# Patient Record
Sex: Male | Born: 1955 | Race: White | Hispanic: No | Marital: Married | Smoking: Never smoker
Health system: Southern US, Community
[De-identification: ages and names within clinical notes are randomized; demographics above are authoritative.]

---

## 2005-09-11 ENCOUNTER — Ambulatory Visit (HOSPITAL_COMMUNITY): Admission: RE | Admit: 2005-09-11 | Discharge: 2005-09-11 | Payer: Self-pay | Admitting: Specialist

## 2005-10-14 ENCOUNTER — Ambulatory Visit (HOSPITAL_COMMUNITY): Admission: RE | Admit: 2005-10-14 | Discharge: 2005-10-16 | Payer: Self-pay | Admitting: Specialist

## 2008-06-18 ENCOUNTER — Encounter: Admission: RE | Admit: 2008-06-18 | Discharge: 2008-06-18 | Payer: Self-pay | Admitting: Specialist

## 2008-06-19 ENCOUNTER — Ambulatory Visit (HOSPITAL_BASED_OUTPATIENT_CLINIC_OR_DEPARTMENT_OTHER): Admission: RE | Admit: 2008-06-19 | Discharge: 2008-06-20 | Payer: Self-pay | Admitting: Specialist

## 2009-06-07 IMAGING — CR DG CHEST 2V
2 series · 2 of 2 positions shown · non-contrast
Comparison: Chest x-ray of 10/14/2005

CLINICAL DATA: Preop for shoulder surgery

CHEST - 2 VIEW

[w chest pa]
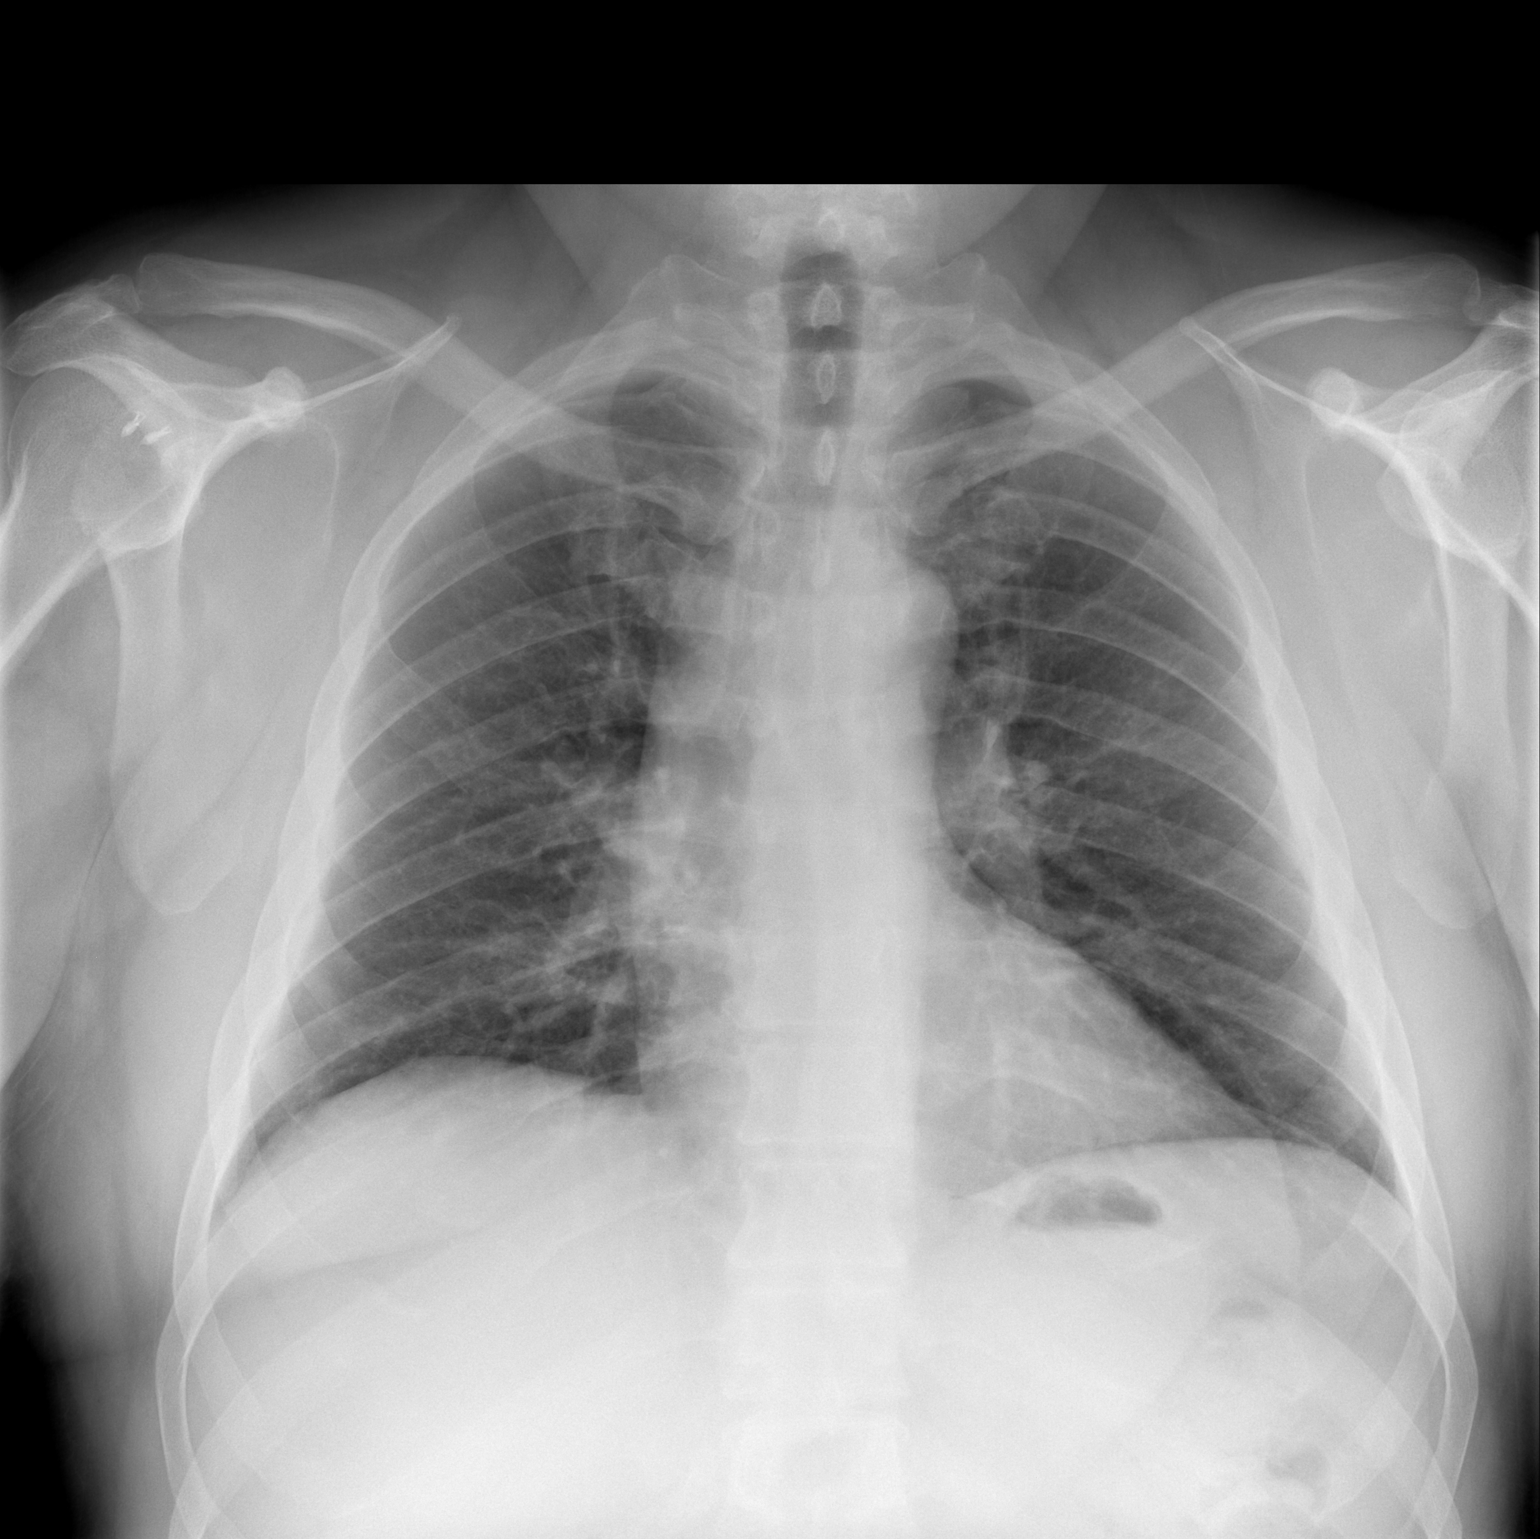

[w chest lat]
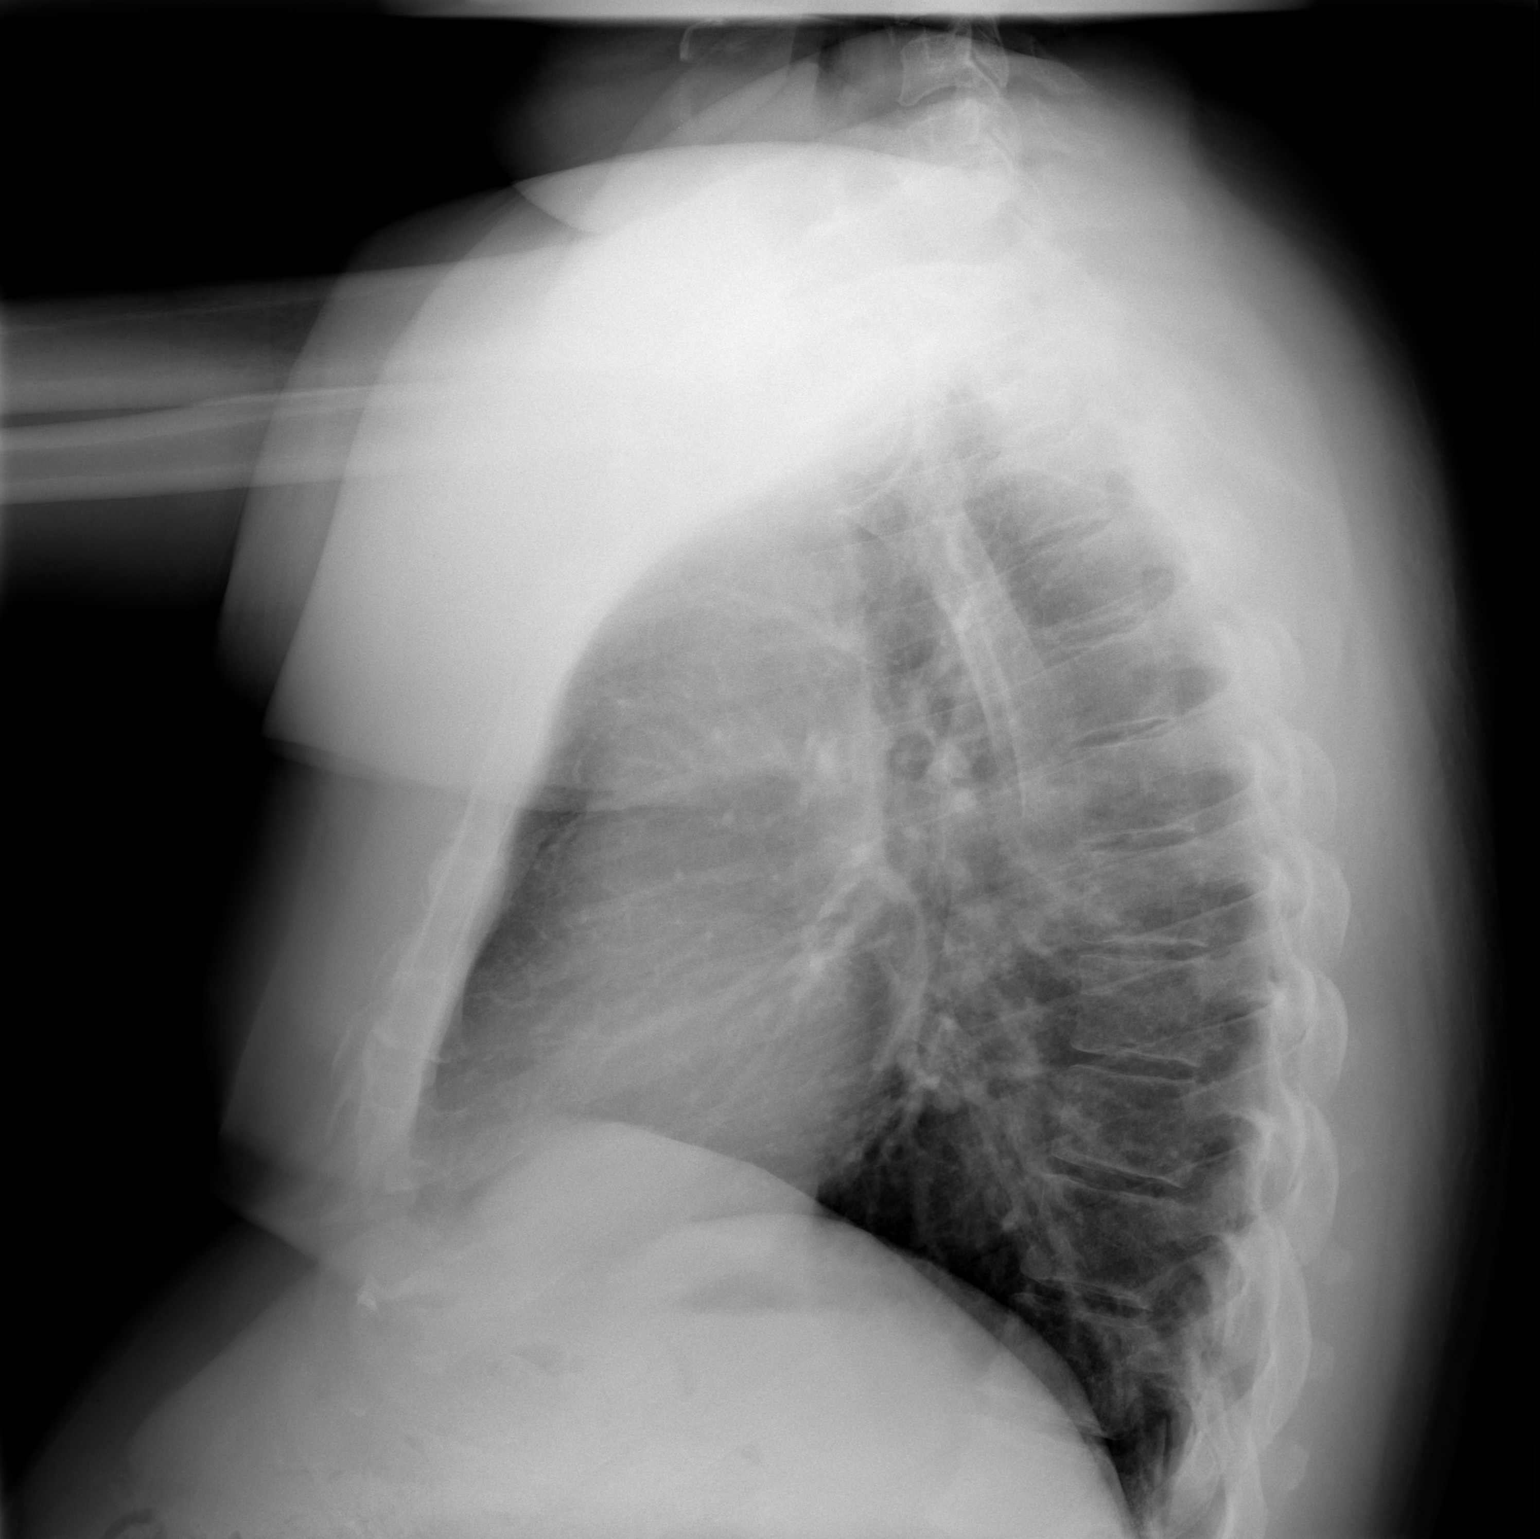

[2 of 2 positions shown; findings below may reference images not displayed]

FINDINGS: No active infiltrate or effusion is seen.  Mild
peribronchial thickening again is noted.  The heart is within upper
limits of normal.  No acute bony abnormality is seen.
IMPRESSION: Stable chest x-ray.  No active lung disease.

## 2010-11-16 NOTE — Op Note (Signed)
NAME:  Larry Boyer, Larry Boyer NO.:  [id number]   MEDICAL RECORD NO.:  [id number]          PATIENT TYPE:  AMB   LOCATION:  DSC                          FACILITY:  [facility]   PHYSICIAN:  Jene Every, M.D.    DATE OF BIRTH:  09-14-1955   DATE OF PROCEDURE:  06/19/2008  DATE OF DISCHARGE:                               OPERATIVE REPORT   PREOPERATIVE DIAGNOSIS:  Rotator cuff tear impingement syndrome, left  shoulder.   POSTOPERATIVE DIAGNOSIS:  Rotator cuff tear impingement syndrome, left  shoulder.   PROCEDURES PERFORMED:  1. Open acromioplasty.  2. Open rotator cuff repair.  3. Bursectomy and subacromial decompression.   ANESTHESIA:  General.   ASSISTANT:  Roma Schanz, PA   BRIEF INDICATION:  This is a 55 year old rotator cuff tear,  supraspinatus, pain, impingement syndrome, indicated for repair.  Risks  and benefits were discussed, wound, bleeding, infection, damage to  neurovascular structures, suboptimal range of motion, and adhesive  capsulitis retear.   TECHNIQUE:  The patient supine on beach chair position.  After induction  of adequate general anesthesia and 2 g of Kefzol, left shoulder and  upper extremities were prepped and draped in the usual sterile fashion.  An incision was made over the anterolateral aspect of the acromion.  Subcutaneous tissue was dissected, electrocauterized with good  hemostasis, raphe between the anterolateral heads was identified and  divided.  Subperiosteal elevator from the anterior and lateral aspect of  the acromion about midway.  CA ligament was detached.  The subacromial  space was digitally lysed.  The hypertrophic bursa was excised.  A small  retracted tear of the rotator cuff 1 cm x 1.5 cm was noted.  We debrided  the edges, fashioned a bed into the bleeding bone with a Matt Holmes rongeur.  We used a bioabsorbable suture anchor into the bone just lateral to the  articular surface after utilizing the awl, the tap  insertion of the Bio-  Anchor with excellent purchase.  There were 2 suture threads coming from  this and we threaded that through the anterolateral and the  posterolateral portion of the tendon, advanced it, held it, and formed a  good surgeon's knot on both sutures.  Then, crossed the sutures and then  put 2 PushLocks over the lateral aspect of the greater tuberosity.  After utilization of the awl, threading to the PushLock, tension placed  on the PushLock, and inserted it in with excellent double-row technique  fully covering the tendon.  Good range of motion without tension on the  site.   Wound was copiously irrigated.  We had used to oscillating saw to remove  the anterolateral aspect of the acromion.  We repaired the raphe with 0  Vicryl interrupted figure-of-eight sutures over and top through the  acromion, subcu with 2-0 Vicryl simple sutures.  Skin was reapproximated  with 3-0 subcuticular Prolene.  Wound was reinforced  with Steri-Strips.  Sterile dressing was applied, placed an abduction  pillow, extubated without difficulty, and transported to the recovery  room in satisfactory condition.   The patient tolerated the procedure well.  No complications.       Jene Every, M.D.  Electronically Signed     JB/MEDQ  D:  06/19/2008  T:  06/20/2008  Job:  [id number]

## 2010-11-19 NOTE — Op Note (Signed)
NAME:  Larry Boyer, Larry Boyer NO.:  [id number]   MEDICAL RECORD NO.:  [id number]          PATIENT TYPE:  OIB   LOCATION:  1516                         FACILITY:  [facility]   PHYSICIAN:  Jene Every, M.D.    DATE OF BIRTH:  1955-07-10   DATE OF PROCEDURE:  10/14/2005  DATE OF DISCHARGE:                                 OPERATIVE REPORT   PREOPERATIVE DIAGNOSES:  Rotator cuff tear, adhesive capsulitis and  impingement syndrome, right shoulder.   POSTOPERATIVE DIAGNOSIS:  Rotator cuff tear, adhesive capsulitis and  impingement syndrome, right shoulder.   PROCEDURES PERFORMED:  Mini-open rotator cuff repair, subacromial  decompression with acromioplasty and bursectomy.   ANESTHESIA:  General.   ASSISTANT:  Roma Schanz, P.A.   BRIEF HISTORY AND INDICATION:  A 55 year old with rotator cuff tear,  persistent pain, impingement, diminished range of motion.  Operative  intervention was indicated for decompression of the subacromial space and  rotator cuff repair.  Risks and benefits discussed including bleeding,  infection, damage to neurovascular structures, no change in symptoms,  worsening symptoms, need for repeat procedure in the future, etc.   TECHNIQUE:  The patient was placed supine in the beach-chair.  After the  induction of adequate general anesthesia and 2 g Kefzol, the right shoulder  and upper extremity was prepped and draped in the usual sterile fashion.  An  incision was made over the anterior aspect of the acromion in Langer's  lines.  Subcutaneous tissue was dissected.  Electrocautery was utilized to  achieve hemostasis.  The raphe between the anterior and lateral heads of the  deltoid was identified and divided approximately 3.5 cm in length.  Subperiosteally elevated over the anterolateral aspect of the acromion and  the anteromedial aspect of the acromion.  A self-retaining retractor was  placed.  The CA ligament was divided and excised.  A large  anterior spur was  removed with a Beyer rongeur.  I then digitally mobilized the subacromial  space with severe adhesions noted.  There was significant impingement still  noted.  Therefore, I used an oscillating saw to enlarge the acromioplasty.  He had good residual acromion noted.  This provided adequate space.  Next  hypertrophic bursa was excised.  There was attenuation and a full-thickness  tear of the rotator cuff at the insertion of the supraspinatus with  significant tendinopathy of the rotator cuff tear noted.  Excised the  degenerated portion of the rotator cuff, curetted a trough in the greater  tuberosity.  I placed two Mitek suture anchors in the trough.  One Mitek  delivery handle failed and it had to be removed as well as the suture  anchor.  The two remaining were placed approximately 1.5 cm apart over the  lateral aspect of the humerus and the greater tuberosity.  They were then  threaded through the thick part of the rotator cuff.  The rotator cuff was  advanced slightly and delivered into the trough, held and repaired with the  Ethibond suture with good repair.  There was some residual tearing that was  repaired  side-to-side with 0 Vicryl interrupted figure-of-eight sutures for  a watertight closure.  Good range without residual impingement and excellent  coverage of the rotator cuff.  Again tendinopathy was noted with edema and  swelling in the rotator cuff tendon.  Inspected the remainder of the tendon  and the tear.  There was no evidence of residual tear.  This was mainly in  the supraspinatus.  Near the conclusion of the case, the patient had some  lower systolic blood pressure, had to be retained in the supine position for  a period of time until fluids were given and blood pressure was restored.  The patient was then placed back in the beach-chair position.  I completed  the acromioplasty with a 3 mm Kerrison and a rasp to smooth the  undersurface.  The wound was  copiously irrigated with antibiotic irrigation,  the raphe repaired with #1 Vicryl in interrupted figure-of-eight sutures  with excellent closure.  The subcutaneous tissue reapproximated with 0 and 2-  0 Vicryl simple sutures.  The skin was reapproximated with 4-0 subcuticular  Prolene.  The wound reinforced with Steri-Strips, a sterile dressing  applied.  Placed in an abduction pillow, extubated without difficulty and  transported to the recovery room in satisfactory condition.   The patient tolerated the procedure well with no complications.      Jene Every, M.D.  Electronically Signed     JB/MEDQ  D:  10/14/2005  T:  10/15/2005  Job:  [id number]

## 2011-04-08 LAB — BASIC METABOLIC PANEL
BUN: 16 mg/dL (ref 6–23)
CO2: 26 mEq/L (ref 19–32)
Calcium: 9.8 mg/dL (ref 8.4–10.5)
Chloride: 101 mEq/L (ref 96–112)
Creatinine, Ser: 1.23 mg/dL (ref 0.4–1.5)
GFR calc Af Amer: >60 mL/min (ref >60)
GFR calc non Af Amer: >60 mL/min (ref >60)
Glucose, Bld: 154 mg/dL — ABNORMAL HIGH (ref 70–99)
Potassium: 3.9 mEq/L (ref 3.5–5.1)
Sodium: 136 mEq/L (ref 135–145)

## 2011-04-08 LAB — CBC
HCT: 47.4 % (ref 39.0–52.0)
Hemoglobin: 16.1 g/dL (ref 13.0–17.0)
MCHC: 34.1 g/dL (ref 30.0–36.0)
MCV: 88.7 fL (ref 78.0–100.0)
Platelets: 263 10*3/uL (ref 150–400)
RBC: 5.34 MIL/uL (ref 4.22–5.81)
RDW: 12.9 % (ref 11.5–15.5)
WBC: 7.8 10*3/uL (ref 4.0–10.5)

## 2014-01-08 ENCOUNTER — Encounter: Payer: Self-pay | Admitting: Gastroenterology

## 2014-03-14 ENCOUNTER — Encounter: Payer: Self-pay | Admitting: Gastroenterology

## 2014-05-09 ENCOUNTER — Encounter: Payer: Self-pay | Admitting: Gastroenterology

## 2019-07-28 ENCOUNTER — Ambulatory Visit: Payer: Medicare Other | Attending: Internal Medicine

## 2019-07-28 DIAGNOSIS — Z23 Encounter for immunization: Secondary | ICD-10-CM | POA: Insufficient documentation

## 2019-07-28 NOTE — Progress Notes (Signed)
   Covid-19 Vaccination Clinic  Name:  Larry Boyer    MRN: [id number] DOB: 1956-03-17  07/28/2019  Larry Boyer was observed post Covid-19 immunization for 15 minutes without incidence. He was provided with Vaccine Information Sheet and instruction to access the V-Safe system.   Larry Boyer was instructed to call 911 with any severe reactions post vaccine: Marland Kitchen Difficulty breathing  . Swelling of your face and throat  . A fast heartbeat  . A bad rash all over your body  . Dizziness and weakness    Immunizations Administered    Name Date Dose VIS Date Route   Pfizer COVID-19 Vaccine 07/28/2019 10:31 AM 0.3 mL 06/14/2019 Intramuscular   Manufacturer: [employer], [employer]   Lot: [id number]   NDC: [address]-4

## 2019-08-19 ENCOUNTER — Ambulatory Visit: Payer: Medicare Other | Attending: Internal Medicine

## 2019-08-19 DIAGNOSIS — Z23 Encounter for immunization: Secondary | ICD-10-CM | POA: Insufficient documentation

## 2019-08-19 NOTE — Progress Notes (Signed)
   Covid-19 Vaccination Clinic  Name:  Larry Boyer    MRN: [id number] DOB: 11/06/1955  08/19/2019  Larry Boyer was observed post Covid-19 immunization for 15 minutes without incidence. He was provided with Vaccine Information Sheet and instruction to access the V-Safe system.   Larry Boyer was instructed to call 911 with any severe reactions post vaccine: Marland Kitchen Difficulty breathing  . Swelling of your face and throat  . A fast heartbeat  . A bad rash all over your body  . Dizziness and weakness    Immunizations Administered    Name Date Dose VIS Date Route   Pfizer COVID-19 Vaccine 08/19/2019  8:23 AM 0.3 mL 06/14/2019 Intramuscular   Manufacturer: [employer], [employer]   Lot: [id number]   NDC: [id number]

## 2020-02-26 ENCOUNTER — Other Ambulatory Visit: Payer: Self-pay | Admitting: Physician Assistant

## 2020-02-26 DIAGNOSIS — N182 Chronic kidney disease, stage 2 (mild): Secondary | ICD-10-CM

## 2020-02-26 DIAGNOSIS — U071 COVID-19: Secondary | ICD-10-CM

## 2020-02-26 DIAGNOSIS — Z794 Long term (current) use of insulin: Secondary | ICD-10-CM

## 2020-02-26 DIAGNOSIS — E119 Type 2 diabetes mellitus without complications: Secondary | ICD-10-CM

## 2020-02-26 NOTE — Progress Notes (Signed)
I connected by phone with Larry Boyer on 02/26/2020 at 7:40 PM to discuss the potential use of a new treatment for mild to moderate COVID-19 viral infection in non-hospitalized patients.  This patient is a 64 y.o. male that meets the FDA criteria for Emergency Use Authorization of COVID monoclonal antibody casirivimab/imdevimab.  Has a (+) direct SARS-CoV-2 viral test result  Has mild or moderate COVID-19   Is NOT hospitalized due to COVID-19  Is within 10 days of symptom onset  Has at least one of the high risk factor(s) for progression to severe COVID-19 and/or hospitalization as defined in EUA.  Specific high risk criteria : Older age (>/= 66 yo), BMI > 25, Chronic Kidney Disease (CKD) and Diabetes   I have spoken and communicated the following to the patient or parent/caregiver regarding COVID monoclonal antibody treatment:  1. FDA has authorized the emergency use for the treatment of mild to moderate COVID-19 in adults and pediatric patients with positive results of direct SARS-CoV-2 viral testing who are 90 years of age and older weighing at least 40 kg, and who are at high risk for progressing to severe COVID-19 and/or hospitalization.  2. The significant known and potential risks and benefits of COVID monoclonal antibody, and the extent to which such potential risks and benefits are unknown.  3. Information on available alternative treatments and the risks and benefits of those alternatives, including clinical trials.  4. Patients treated with COVID monoclonal antibody should continue to self-isolate and use infection control measures (e.g., wear mask, isolate, social distance, avoid sharing personal items, clean and disinfect "high touch" surfaces, and frequent handwashing) according to CDC guidelines.   5. The patient or parent/caregiver has the option to accept or refuse COVID monoclonal antibody treatment.  After reviewing this information with the patient, The patient  agreed to proceed with receiving casirivimab\imdevimab infusion and will be provided a copy of the Fact sheet prior to receiving the infusion.   Larry Boyer 02/26/2020 7:40 PM

## 2020-02-27 ENCOUNTER — Ambulatory Visit (HOSPITAL_COMMUNITY)
Admission: RE | Admit: 2020-02-27 | Discharge: 2020-02-27 | Disposition: A | Discharge status: Home / Self Care | Payer: Medicare Other | Source: Ambulatory Visit | Attending: Pulmonary Disease | Admitting: Pulmonary Disease

## 2020-02-27 DIAGNOSIS — Z23 Encounter for immunization: Secondary | ICD-10-CM | POA: Insufficient documentation

## 2020-02-27 DIAGNOSIS — U071 COVID-19: Secondary | ICD-10-CM | POA: Diagnosis present

## 2020-02-27 MED ORDER — DIPHENHYDRAMINE HCL 50 MG/ML IJ SOLN: 50.0000 mg | Freq: Once | INTRAMUSCULAR | Status: DC | PRN

## 2020-02-27 MED ORDER — FAMOTIDINE IN NACL 20-0.9 MG/50ML-% IV SOLN: 20.0000 mg | Freq: Once | INTRAVENOUS | Status: DC | PRN

## 2020-02-27 MED ORDER — EPINEPHRINE 0.3 MG/0.3ML IJ SOAJ: 0.3000 mg | Freq: Once | INTRAMUSCULAR | Status: DC | PRN

## 2020-02-27 MED ORDER — SODIUM CHLORIDE 0.9 % IV SOLN: INTRAVENOUS | Status: DC | PRN

## 2020-02-27 MED ORDER — METHYLPREDNISOLONE SODIUM SUCC 125 MG IJ SOLR: 125.0000 mg | Freq: Once | INTRAMUSCULAR | Status: DC | PRN

## 2020-02-27 MED ORDER — ALBUTEROL SULFATE HFA 108 (90 BASE) MCG/ACT IN AERS: 2.0000 | INHALATION_SPRAY | Freq: Once | RESPIRATORY_TRACT | Status: DC | PRN

## 2020-02-27 MED ORDER — SODIUM CHLORIDE 0.9 % IV SOLN
1200.0000 mg | Freq: Once | INTRAVENOUS | Status: AC
  Administered 2020-02-27: 1200 mg via INTRAVENOUS
  Filled 2020-02-27: qty 10

## 2020-02-27 NOTE — Discharge Instructions (Signed)

## 2020-02-27 NOTE — Progress Notes (Signed)
  Diagnosis: COVID-19  Physician:Dr Wright  Procedure: Covid Infusion Clinic Med: casirivimab\imdevimab infusion - Provided patient with casirivimab\imdevimab fact sheet for patients, parents and caregivers prior to infusion.  Complications: No immediate complications noted.  Discharge: Discharged home   Larry Boyer 02/27/2020  

## 2021-07-30 ENCOUNTER — Emergency Department (HOSPITAL_BASED_OUTPATIENT_CLINIC_OR_DEPARTMENT_OTHER): Payer: Medicare Other

## 2021-07-30 ENCOUNTER — Other Ambulatory Visit: Payer: Self-pay

## 2021-07-30 ENCOUNTER — Emergency Department (HOSPITAL_BASED_OUTPATIENT_CLINIC_OR_DEPARTMENT_OTHER)
Admission: EM | Admit: 2021-07-30 | Discharge: 2021-07-30 | Disposition: A | Discharge status: Home / Self Care | Payer: Medicare Other | Attending: Emergency Medicine | Admitting: Emergency Medicine

## 2021-07-30 ENCOUNTER — Other Ambulatory Visit (HOSPITAL_BASED_OUTPATIENT_CLINIC_OR_DEPARTMENT_OTHER): Payer: Self-pay

## 2021-07-30 ENCOUNTER — Encounter (HOSPITAL_BASED_OUTPATIENT_CLINIC_OR_DEPARTMENT_OTHER): Payer: Self-pay

## 2021-07-30 DIAGNOSIS — N390 Urinary tract infection, site not specified: Secondary | ICD-10-CM

## 2021-07-30 DIAGNOSIS — N3091 Cystitis, unspecified with hematuria: Secondary | ICD-10-CM | POA: Insufficient documentation

## 2021-07-30 DIAGNOSIS — R309 Painful micturition, unspecified: Secondary | ICD-10-CM | POA: Diagnosis present

## 2021-07-30 DIAGNOSIS — R319 Hematuria, unspecified: Secondary | ICD-10-CM

## 2021-07-30 DIAGNOSIS — R6883 Chills (without fever): Secondary | ICD-10-CM | POA: Diagnosis not present

## 2021-07-30 DIAGNOSIS — N410 Acute prostatitis: Secondary | ICD-10-CM | POA: Diagnosis not present

## 2021-07-30 LAB — COMPREHENSIVE METABOLIC PANEL
ALT: 16 U/L (ref 0–44)
AST: 11 U/L — ABNORMAL LOW (ref 15–41)
Albumin: 4.1 g/dL (ref 3.5–5.0)
Alkaline Phosphatase: 87 U/L (ref 38–126)
Anion gap: 11 (ref 5–15)
BUN: 19 mg/dL (ref 8–23)
CO2: 22 mmol/L (ref 22–32)
Calcium: 9.4 mg/dL (ref 8.9–10.3)
Chloride: 96 mmol/L — ABNORMAL LOW (ref 98–111)
Creatinine, Ser: 1.11 mg/dL (ref 0.61–1.24)
GFR, Estimated: >60 mL/min (ref >60)
Glucose, Bld: 203 mg/dL — ABNORMAL HIGH (ref 70–99)
Potassium: 3.5 mmol/L (ref 3.5–5.1)
Sodium: 129 mmol/L — ABNORMAL LOW (ref 135–145)
Total Bilirubin: 1.1 mg/dL (ref 0.3–1.2)
Total Protein: 6.7 g/dL (ref 6.5–8.1)

## 2021-07-30 LAB — URINALYSIS, ROUTINE W REFLEX MICROSCOPIC
Bilirubin Urine: NEGATIVE
Glucose, UA: >1000 mg/dL — AB
Ketones, ur: 15 mg/dL — AB
Nitrite: NEGATIVE
Protein, ur: NEGATIVE mg/dL
Specific Gravity, Urine: 1.03 (ref 1.005–1.030)
WBC, UA: >50 WBC/hpf — ABNORMAL HIGH (ref 0–5)
pH: 5 (ref 5.0–8.0)

## 2021-07-30 LAB — CBC WITH DIFFERENTIAL/PLATELET
Abs Immature Granulocytes: 0.07 10*3/uL (ref 0.00–0.07)
Basophils Absolute: 0 10*3/uL (ref 0.0–0.1)
Basophils Relative: 0 %
Eosinophils Absolute: 0 10*3/uL (ref 0.0–0.5)
Eosinophils Relative: 0 %
HCT: 43.5 % (ref 39.0–52.0)
Hemoglobin: 14.9 g/dL (ref 13.0–17.0)
Immature Granulocytes: 1 %
Lymphocytes Relative: 8 %
Lymphs Abs: 1.2 10*3/uL (ref 0.7–4.0)
MCH: 29.7 pg (ref 26.0–34.0)
MCHC: 34.3 g/dL (ref 30.0–36.0)
MCV: 86.7 fL (ref 80.0–100.0)
Monocytes Absolute: 0.7 10*3/uL (ref 0.1–1.0)
Monocytes Relative: 5 %
Neutro Abs: 12.1 10*3/uL — ABNORMAL HIGH (ref 1.7–7.7)
Neutrophils Relative %: 86 %
Platelets: 206 10*3/uL (ref 150–400)
RBC: 5.02 MIL/uL (ref 4.22–5.81)
RDW: 12.2 % (ref 11.5–15.5)
WBC: 14 10*3/uL — ABNORMAL HIGH (ref 4.0–10.5)
nRBC: 0 % (ref 0.0–0.2)

## 2021-07-30 MED ORDER — SODIUM CHLORIDE 0.9 % IV BOLUS
1000.0000 mL | Freq: Once | INTRAVENOUS | Status: AC
  Administered 2021-07-30: 1000 mL via INTRAVENOUS

## 2021-07-30 MED ORDER — SULFAMETHOXAZOLE-TRIMETHOPRIM 800-160 MG PO TABS
1.0000 | ORAL_TABLET | Freq: Two times a day (BID) | ORAL | 0 refills | Status: AC
  Filled 2021-07-30: qty 14, 7d supply, fill #0

## 2021-07-30 MED ORDER — SODIUM CHLORIDE 0.9 % IV SOLN
1.0000 g | Freq: Once | INTRAVENOUS | Status: AC
  Administered 2021-07-30: 1 g via INTRAVENOUS
  Filled 2021-07-30: qty 10

## 2021-07-30 MED ORDER — ACETAMINOPHEN 325 MG PO TABS
650.0000 mg | ORAL_TABLET | Freq: Once | ORAL | Status: AC
  Administered 2021-07-30: 650 mg via ORAL
  Filled 2021-07-30: qty 2

## 2021-07-30 NOTE — ED Provider Notes (Signed)
[facility] EMERGENCY DEPT Provider Note   CSN: [id number] Arrival date & time: 07/30/21  1301     History  Chief Complaint  Patient presents with   Urinary Frequency   Flank Pain    Larry Boyer is a 66 y.o. male.  Patient presents with right lower flank pain and frequency with burning sensation.  Symptoms ongoing since yesterday.  He states that he had chills yesterday, but no recorded temperatures.  Currently states that the symptoms have improved currently and she feels much better at this time.      Home Medications Prior to Admission medications   Medication Sig Start Date End Date Taking? Authorizing Provider  sulfamethoxazole-trimethoprim (BACTRIM DS) 800-160 MG tablet Take 1 tablet by mouth 2 (two) times daily for 7 days. 07/30/21 08/06/21 Yes Luna Fuse, MD      Allergies    Patient has no known allergies.    Review of Systems   Review of Systems  Constitutional:  Positive for chills. Negative for fever.  HENT:  Negative for ear pain and sore throat.   Eyes:  Negative for pain.  Respiratory:  Negative for cough.   Cardiovascular:  Negative for chest pain.  Gastrointestinal:  Negative for abdominal pain.  Genitourinary:  Positive for flank pain.  Musculoskeletal:  Negative for back pain.  Skin:  Negative for color change and rash.  Neurological:  Negative for syncope.  All other systems reviewed and are negative.  Physical Exam Updated Vital Signs BP 105/76    Pulse 98    Temp 99.8 F (37.7 C) (Oral)    Resp 17    Ht 6\' 4"  (1.93 m)    Wt 104.3 kg    SpO2 93%    BMI 28.00 kg/m  Physical Exam Constitutional:      Appearance: He is well-developed.  HENT:     Head: Normocephalic.     Nose: Nose normal.  Eyes:     Extraocular Movements: Extraocular movements intact.  Cardiovascular:     Rate and Rhythm: Normal rate.  Pulmonary:     Effort: Pulmonary effort is normal.  Abdominal:     Tenderness: There is no abdominal tenderness.  There is no right CVA tenderness, left CVA tenderness, guarding or rebound.  Skin:    Coloration: Skin is not jaundiced.  Neurological:     Mental Status: He is alert. Mental status is at baseline.    ED Results / Procedures / Treatments   Labs (all labs ordered are listed, but only abnormal results are displayed) Labs Reviewed  URINALYSIS, ROUTINE W REFLEX MICROSCOPIC - Abnormal; Notable for the following components:      Result Value   Glucose, UA >1,000 (*)    Hgb urine dipstick SMALL (*)    Ketones, ur 15 (*)    Leukocytes,Ua SMALL (*)    WBC, UA >50 (*)    Bacteria, UA RARE (*)    All other components within normal limits  CBC WITH DIFFERENTIAL/PLATELET - Abnormal; Notable for the following components:   WBC 14.0 (*)    Neutro Abs 12.1 (*)    All other components within normal limits  COMPREHENSIVE METABOLIC PANEL - Abnormal; Notable for the following components:   Sodium 129 (*)    Chloride 96 (*)    Glucose, Bld 203 (*)    AST 11 (*)    All other components within normal limits  CULTURE, BLOOD (ROUTINE X 2)  CULTURE, BLOOD (ROUTINE X 2)  URINE CULTURE    EKG None  Radiology CT Renal Stone Study  Result Date: 07/30/2021 CLINICAL DATA:  Flank pain, kidney stone suspected EXAM: CT ABDOMEN AND PELVIS WITHOUT CONTRAST TECHNIQUE: Multidetector CT imaging of the abdomen and pelvis was performed following the standard protocol without IV contrast. RADIATION DOSE REDUCTION: This exam was performed according to the departmental dose-optimization program which includes automated exposure control, adjustment of the mA and/or kV according to patient size and/or use of iterative reconstruction technique. COMPARISON:  None. FINDINGS: Lower chest: Patchy opacities in the right lung base, favored to represent postinflammatory changes. Hepatobiliary: No focal liver abnormality is seen. No gallstones, gallbladder wall thickening, or biliary dilatation. Pancreas: Unremarkable. No  pancreatic ductal dilatation or surrounding inflammatory changes. Spleen: Normal in size without focal abnormality. Adrenals/Urinary Tract: Adrenal glands are unremarkable. Kidneys are normal, without renal calculi, focal lesion, or hydronephrosis. Mild stranding along the base of the bladder/near the prostate. The bladder is otherwise unremarkable. Stomach/Bowel: The stomach is within normal limits. There is no evidence of bowel obstruction.Normal appendix. Sigmoid diverticulosis. No diverticulitis. Moderate colonic stool burden. Vascular/Lymphatic: Aortoiliac atherosclerotic calcifications. No AAA. No lymphadenopathy. Reproductive: Mildly enlarged prostate with a somewhat heterogeneous appearance and adjacent stranding. Other: No abdominal wall hernia or abnormality. No abdominopelvic ascites. Musculoskeletal: No acute osseous abnormality. Multilevel degenerative changes of the spine. Bilateral hip osteoarthritis. IMPRESSION: Mildly enlarged heterogeneous prostate with adjacent inflammatory stranding suggesting the possibility of prostatitis. No obstructive uropathy. Patchy opacities in right lung base, favored to represent postinflammatory changes. Electronically Signed   By: Maurine Simmering M.D.   On: 07/30/2021 14:23    Procedures Procedures    Medications Ordered in ED Medications  cefTRIAXone (ROCEPHIN) 1 g in sodium chloride 0.9 % 100 mL IVPB (has no administration in time range)  sodium chloride 0.9 % bolus 1,000 mL (has no administration in time range)  acetaminophen (TYLENOL) tablet 650 mg (650 mg Oral Given 07/30/21 1346)    ED Course/ Medical Decision Making/ A&P                           Medical Decision Making Amount and/or Complexity of Data Reviewed Independent Historian: spouse    Details: Review of records show no recent clinical visits within the last 3 years. Labs: ordered. Radiology: ordered. Discussion of management or test interpretation with external provider(s): Labs show  mild leukocytosis, some hyponatremia noted as well, given IV fluid resuscitation.  CT abdomen pelvis shows no evidence of kidney stone, some concern for prostatic infection.  Urinalysis positive for WBCs and rare bacteria.  Given patient is well-appearing with improved symptoms, given IV Rocephin here in the ER.  Discharged home with a prescription of Bactrim.  Advised outpatient follow-up with urology within 2 to 3 days, advising immediate return for fevers worsening symptoms or any additional concerns.   Risk OTC drugs. Decision regarding hospitalization. Risk Details: Patient does have elevated white count but he has improvement of symptoms with no pain or discomfort at this time.  CT findings also appear mild.  Vital signs otherwise remained stable.  I feel he is a good candidate for trial of outpatient antibiotics.           Final Clinical Impression(s) / ED Diagnoses Final diagnoses:  Acute prostatitis  Urinary tract infection with hematuria, site unspecified    Rx / DC Orders ED Discharge Orders          Ordered    sulfamethoxazole-trimethoprim (  BACTRIM DS) 800-160 MG tablet  2 times daily        07/30/21 1528              Luna Fuse, MD 07/30/21 1554

## 2021-07-30 NOTE — Discharge Instructions (Signed)
Call your primary care doctor or specialist as discussed in the next 2-3 days.   Return immediately back to the ER if:  Your symptoms worsen within the next 12-24 hours. You develop new symptoms such as new fevers, persistent vomiting, new pain, shortness of breath, or new weakness or numbness, or if you have any other concerns.  

## 2021-07-30 NOTE — ED Notes (Signed)
Pt stated that he was having pain to his lower back (kidney area) right side, pain increases during urination and noticed slight blood last night during urination. Never had this pain before.

## 2021-07-30 NOTE — ED Triage Notes (Signed)
Pt. States when urinating he feels burning sensation, flank pain, and states urine was pink tinged last night. Patient states that burning started yesterday. Pt. States he has been having chills with flank pain. Denies having temperature. Pt states he took advil 600 mg 11:30 am for pain. Pt states he had some relief with advil. Pt also states he is having a headache as well .

## 2021-08-01 LAB — URINE CULTURE: Culture: >=100000 — AB

## 2021-08-02 ENCOUNTER — Telehealth: Payer: Self-pay

## 2021-08-02 NOTE — Telephone Encounter (Signed)
Post ED Visit - Positive Culture Follow-up  Culture report reviewed by antimicrobial stewardship pharmacist: [facility] [x]  , Pharm.D. []  Jerrilyn Cairo, Pharm.D., BCPS AQ-ID []  , Pharm.D., BCPS []  Celedonio Miyamoto, Pharm.D., BCPS []  [place], Garvin Fila.D., BCPS, AAHIVP []  , Pharm.D., BCPS, AAHIVP []  Georgina Pillion, PharmD, BCPS []  , PharmD, BCPS []  [place], PharmD, BCPS []  [address], PharmD []  , PharmD, BCPS []  Estella Husk, PharmD  Pharmacy Team []  Lysle Pearl, PharmD []  , PharmD []  Phillips Climes, PharmD []  , Rph []  Agapito Games) , PharmD []  Verlan Friends, PharmD []  , PharmD []  Mervyn Gay, PharmD []  , PharmD []  Vinnie Level, PharmD []  [facility], PharmD []  , PharmD []  Len Childs, PharmD   Positive urine culture Treated with Sulfamethoxazole-Trimethoprim, organism sensitive to the same and no further patient follow-up is required at this time.  08/02/2021, 11:24 AM

## 2021-08-04 LAB — CULTURE, BLOOD (ROUTINE X 2)
Culture: NO GROWTH
Culture: NO GROWTH
Special Requests: ADEQUATE
Special Requests: ADEQUATE

## 2022-07-19 IMAGING — CT CT RENAL STONE PROTOCOL
2 of 4 series · 16 of 46 positions shown, 18 images · non-contrast
Comparison: None.

CLINICAL DATA: Flank pain, kidney stone suspected



[Series 2: stone full · axial · 0.85mm/px · z∈[-737,-277]mm · 13 of 101 slices shown, 15 images]
[im 5/101  soft-tissue]
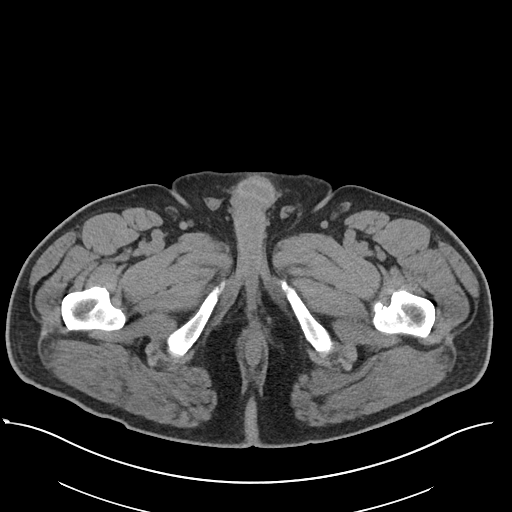
[im 5/101  bone]
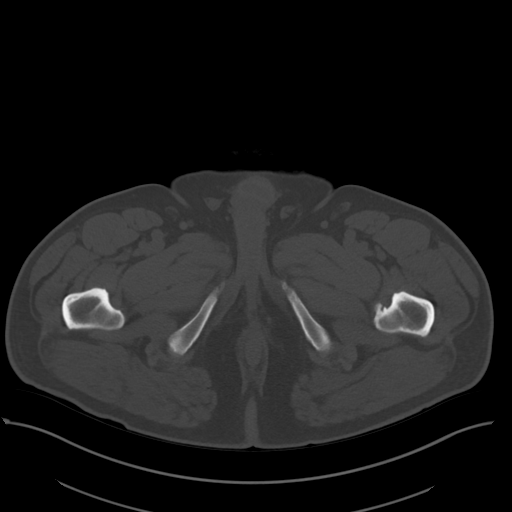
[im 13/101  soft-tissue]
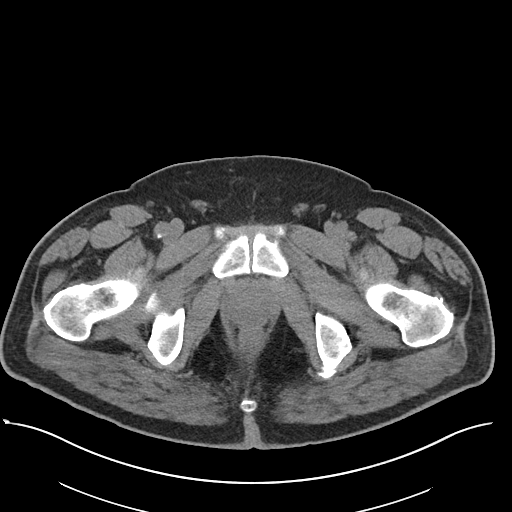
[im 21/101  soft-tissue]
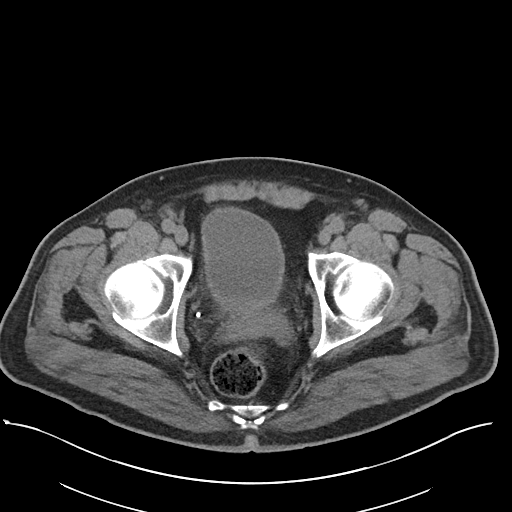
[im 29/101  soft-tissue]
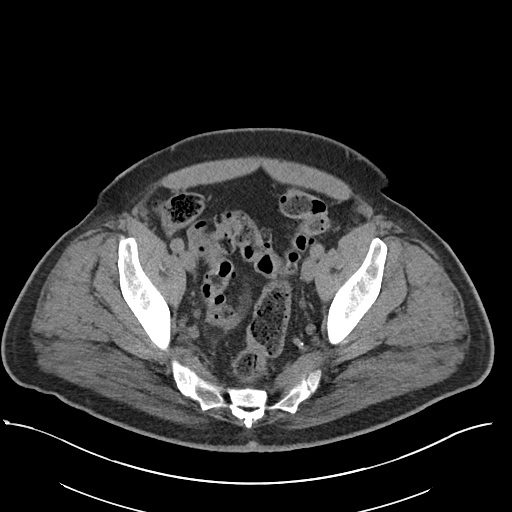
[im 37/101  soft-tissue]
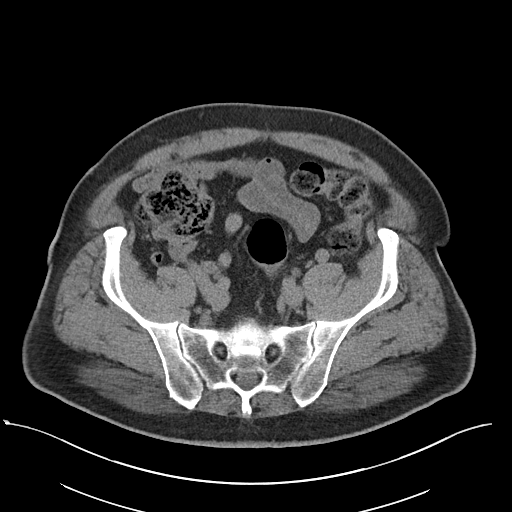
[im 45/101  soft-tissue]
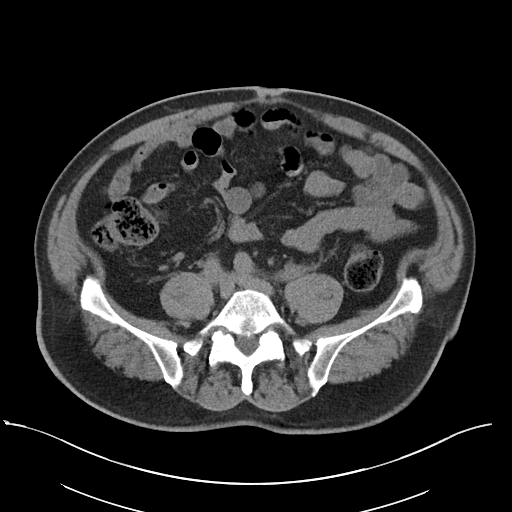
[im 53/101  soft-tissue]
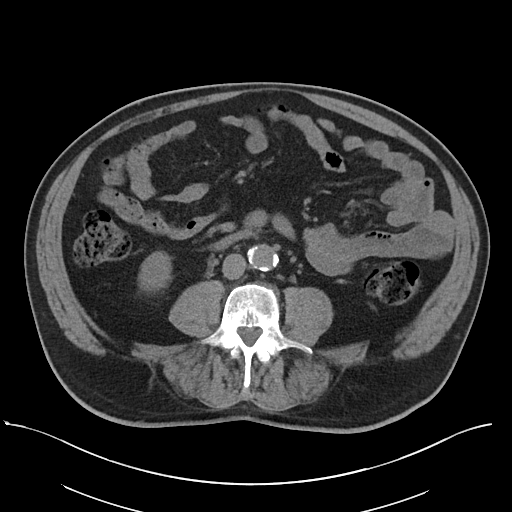
[im 57/101  soft-tissue]
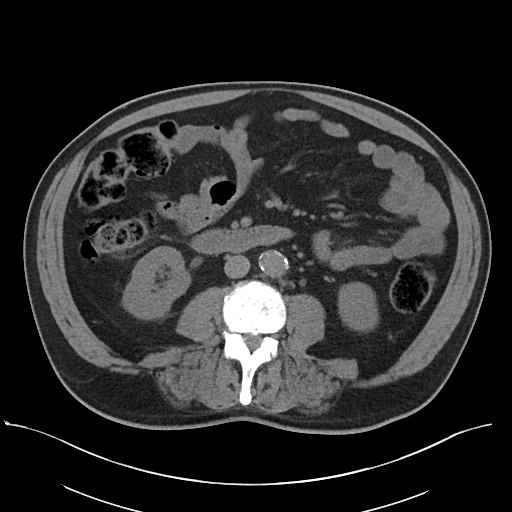
[im 65/101  soft-tissue]
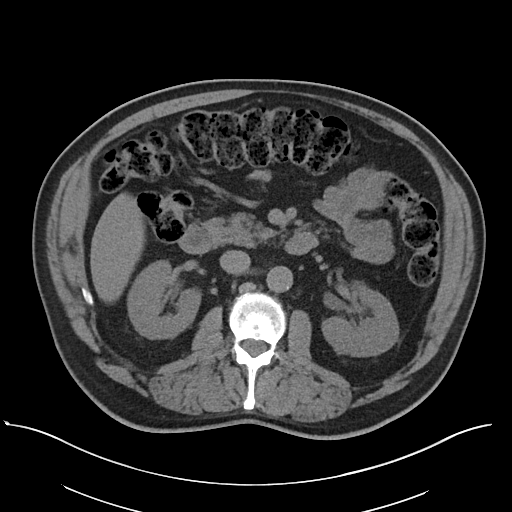
[im 65/101  bone]
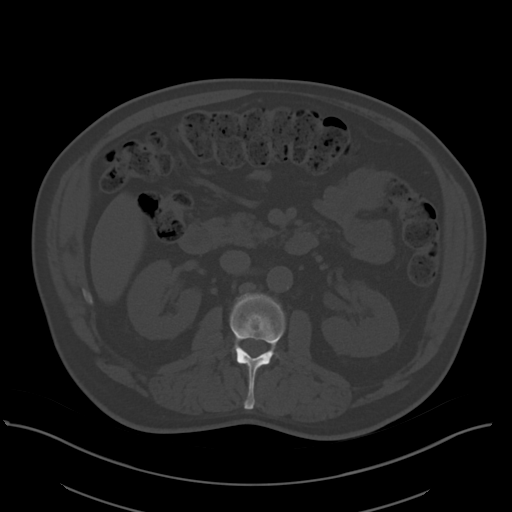
[im 73/101  soft-tissue]
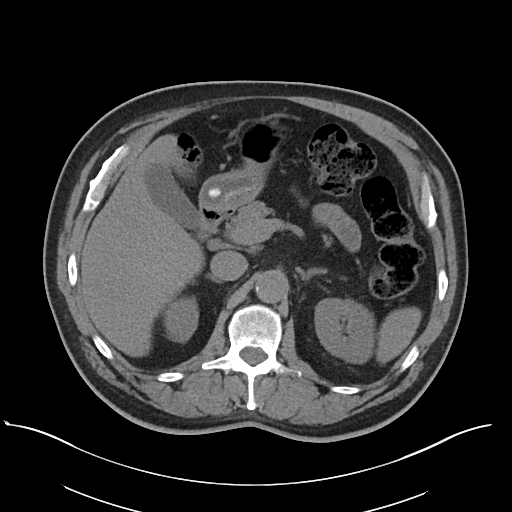
[im 81/101  soft-tissue]
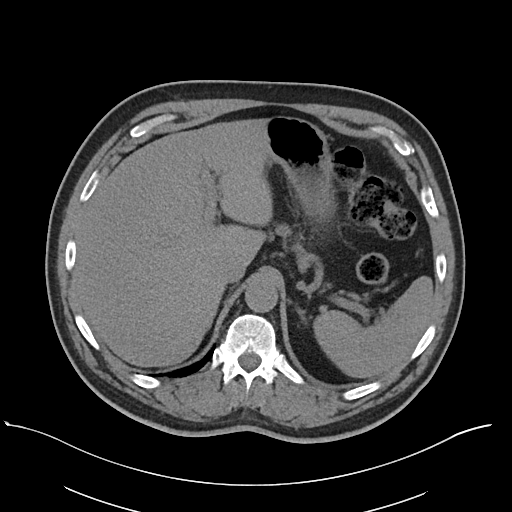
[im 89/101  soft-tissue]
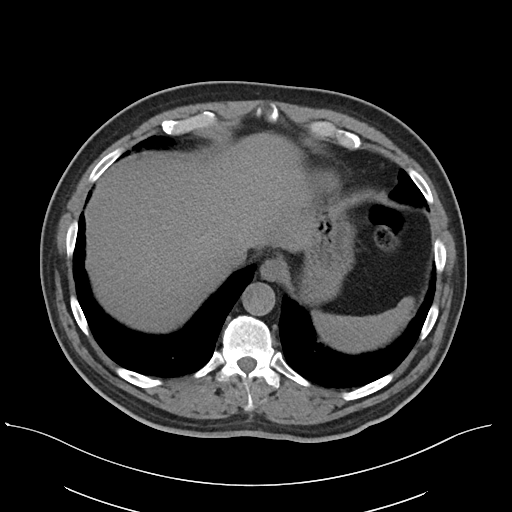
[im 97/101  soft-tissue]
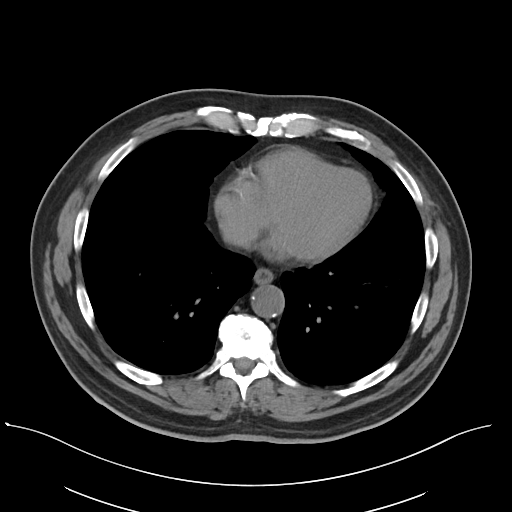

[Series 5: coronal · coronal · 0.91mm/px · 3 of 109 slices shown]
[im 37/109  soft-tissue]
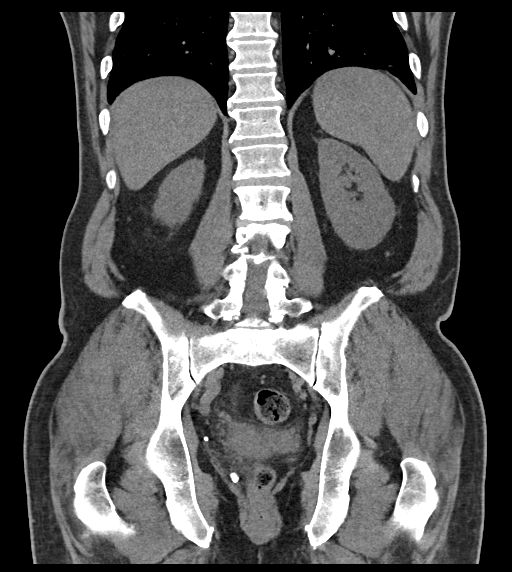
[im 49/109  soft-tissue]
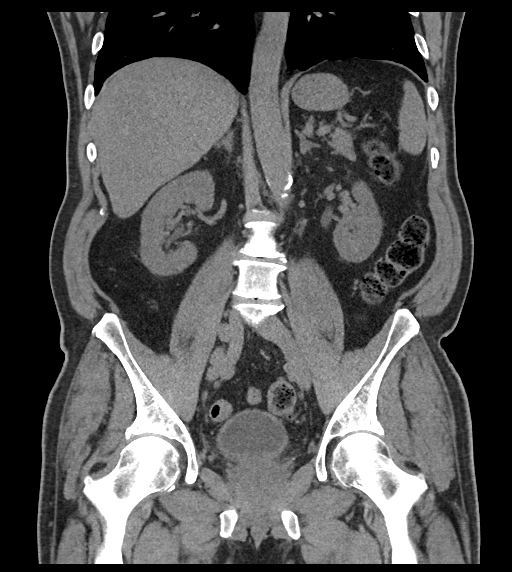
[im 61/109  soft-tissue]
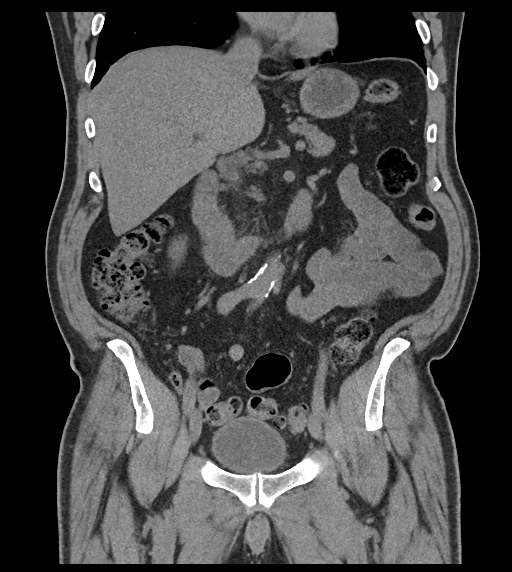

[16 of 46 positions shown; findings below may reference images not displayed]

FINDINGS: Lower chest: Patchy opacities in the right lung base, favored to
represent postinflammatory changes.

Hepatobiliary: No focal liver abnormality is seen. No gallstones,
gallbladder wall thickening, or biliary dilatation.

Pancreas: Unremarkable. No pancreatic ductal dilatation or
surrounding inflammatory changes.

Spleen: Normal in size without focal abnormality.

Adrenals/Urinary Tract: Adrenal glands are unremarkable. Kidneys are
normal, without renal calculi, focal lesion, or hydronephrosis. Mild
stranding along the base of the bladder/near the prostate. The
bladder is otherwise unremarkable.

Stomach/Bowel: The stomach is within normal limits. There is no
evidence of bowel obstruction.Normal appendix. Sigmoid
diverticulosis. No diverticulitis. Moderate colonic stool burden.

Vascular/Lymphatic: Aortoiliac atherosclerotic calcifications. No
AAA. No lymphadenopathy.

Reproductive: Mildly enlarged prostate with a somewhat heterogeneous
appearance and adjacent stranding.

Other: No abdominal wall hernia or abnormality. No abdominopelvic
ascites.

Musculoskeletal: No acute osseous abnormality. Multilevel
degenerative changes of the spine. Bilateral hip osteoarthritis.
IMPRESSION: Mildly enlarged heterogeneous prostate with adjacent inflammatory
stranding suggesting the possibility of prostatitis. No obstructive
uropathy.

Patchy opacities in right lung base, favored to represent
postinflammatory changes.

## 2023-06-23 ENCOUNTER — Other Ambulatory Visit (HOSPITAL_BASED_OUTPATIENT_CLINIC_OR_DEPARTMENT_OTHER): Payer: Self-pay

## 2023-06-23 MED ORDER — OZEMPIC (1 MG/DOSE) 4 MG/3ML ~~LOC~~ SOPN
1.0000 mg | PEN_INJECTOR | SUBCUTANEOUS | 5 refills | Status: AC
  Filled 2023-06-23: qty 3, 28d supply, fill #0
  Filled 2023-07-24: qty 3, 28d supply, fill #1

## 2023-07-24 ENCOUNTER — Other Ambulatory Visit (HOSPITAL_BASED_OUTPATIENT_CLINIC_OR_DEPARTMENT_OTHER): Payer: Self-pay

## 2023-07-25 ENCOUNTER — Other Ambulatory Visit (HOSPITAL_BASED_OUTPATIENT_CLINIC_OR_DEPARTMENT_OTHER): Payer: Self-pay

## 2023-07-31 ENCOUNTER — Other Ambulatory Visit (HOSPITAL_BASED_OUTPATIENT_CLINIC_OR_DEPARTMENT_OTHER): Payer: Self-pay

## 2023-07-31 MED ORDER — OZEMPIC (1 MG/DOSE) 4 MG/3ML ~~LOC~~ SOPN
1.0000 mg | PEN_INJECTOR | SUBCUTANEOUS | 5 refills | Status: AC
  Filled 2023-07-31: qty 3, 28d supply, fill #0

## 2023-08-01 ENCOUNTER — Other Ambulatory Visit (HOSPITAL_BASED_OUTPATIENT_CLINIC_OR_DEPARTMENT_OTHER): Payer: Self-pay

## 2023-08-09 ENCOUNTER — Encounter: Payer: Self-pay | Admitting: Family Medicine

## 2023-08-09 ENCOUNTER — Other Ambulatory Visit: Payer: Self-pay | Admitting: Internal Medicine

## 2023-08-09 DIAGNOSIS — J3489 Other specified disorders of nose and nasal sinuses: Secondary | ICD-10-CM

## 2023-08-11 ENCOUNTER — Ambulatory Visit
Admission: RE | Admit: 2023-08-11 | Discharge: 2023-08-11 | Disposition: A | Discharge status: Home / Self Care | Payer: Medicare Other | Source: Ambulatory Visit | Attending: Internal Medicine | Admitting: Internal Medicine

## 2023-08-11 DIAGNOSIS — J3489 Other specified disorders of nose and nasal sinuses: Secondary | ICD-10-CM

## 2023-08-14 ENCOUNTER — Other Ambulatory Visit: Payer: Medicare Other

## 2023-08-25 ENCOUNTER — Other Ambulatory Visit (HOSPITAL_BASED_OUTPATIENT_CLINIC_OR_DEPARTMENT_OTHER): Payer: Self-pay

## 2023-08-25 MED ORDER — OZEMPIC (0.25 OR 0.5 MG/DOSE) 2 MG/3ML ~~LOC~~ SOPN
0.5000 mg | PEN_INJECTOR | SUBCUTANEOUS | 0 refills | Status: DC
  Filled 2023-08-25: qty 3, 28d supply, fill #0

## 2023-09-25 ENCOUNTER — Other Ambulatory Visit (HOSPITAL_BASED_OUTPATIENT_CLINIC_OR_DEPARTMENT_OTHER): Payer: Self-pay

## 2023-09-26 ENCOUNTER — Other Ambulatory Visit (HOSPITAL_BASED_OUTPATIENT_CLINIC_OR_DEPARTMENT_OTHER): Payer: Self-pay

## 2023-09-26 MED ORDER — OZEMPIC (0.25 OR 0.5 MG/DOSE) 2 MG/3ML ~~LOC~~ SOPN
0.5000 mg | PEN_INJECTOR | SUBCUTANEOUS | 5 refills | Status: DC
  Filled 2023-09-26: qty 3, 28d supply, fill #0
  Filled 2023-10-20: qty 3, 28d supply, fill #1
  Filled 2023-11-15: qty 3, 28d supply, fill #2
  Filled 2023-12-15: qty 3, 28d supply, fill #3
  Filled 2023-12-23: qty 3, 28d supply, fill #4
  Filled 2024-01-23: qty 3, 28d supply, fill #5

## 2023-12-23 ENCOUNTER — Other Ambulatory Visit (HOSPITAL_BASED_OUTPATIENT_CLINIC_OR_DEPARTMENT_OTHER): Payer: Self-pay

## 2024-01-20 ENCOUNTER — Other Ambulatory Visit: Payer: Self-pay

## 2024-01-20 ENCOUNTER — Encounter (HOSPITAL_COMMUNITY): Payer: Self-pay

## 2024-01-20 ENCOUNTER — Emergency Department (HOSPITAL_COMMUNITY)
Admission: EM | Admit: 2024-01-20 | Discharge: 2024-01-20 | Disposition: A | Discharge status: Home / Self Care | Attending: Emergency Medicine | Admitting: Emergency Medicine

## 2024-01-20 DIAGNOSIS — R42 Dizziness and giddiness: Secondary | ICD-10-CM | POA: Insufficient documentation

## 2024-01-20 DIAGNOSIS — E119 Type 2 diabetes mellitus without complications: Secondary | ICD-10-CM | POA: Insufficient documentation

## 2024-01-20 DIAGNOSIS — I10 Essential (primary) hypertension: Secondary | ICD-10-CM | POA: Insufficient documentation

## 2024-01-20 DIAGNOSIS — R55 Syncope and collapse: Secondary | ICD-10-CM | POA: Diagnosis present

## 2024-01-20 LAB — BASIC METABOLIC PANEL WITH GFR
Anion gap: 11 (ref 5–15)
BUN: 24 mg/dL — ABNORMAL HIGH (ref 8–23)
CO2: 26 mmol/L (ref 22–32)
Calcium: 9.3 mg/dL (ref 8.9–10.3)
Chloride: 99 mmol/L (ref 98–111)
Creatinine, Ser: 1.25 mg/dL — ABNORMAL HIGH (ref 0.61–1.24)
GFR, Estimated: >60 mL/min (ref >60)
Glucose, Bld: 163 mg/dL — ABNORMAL HIGH (ref 70–99)
Potassium: 3.9 mmol/L (ref 3.5–5.1)
Sodium: 136 mmol/L (ref 135–145)

## 2024-01-20 LAB — CBC WITH DIFFERENTIAL/PLATELET
Abs Immature Granulocytes: 0.03 K/uL (ref 0.00–0.07)
Basophils Absolute: 0 K/uL (ref 0.0–0.1)
Basophils Relative: 1 %
Eosinophils Absolute: 0.3 K/uL (ref 0.0–0.5)
Eosinophils Relative: 4 %
HCT: 44.9 % (ref 39.0–52.0)
Hemoglobin: 15 g/dL (ref 13.0–17.0)
Immature Granulocytes: 0 %
Lymphocytes Relative: 22 %
Lymphs Abs: 1.7 K/uL (ref 0.7–4.0)
MCH: 30.4 pg (ref 26.0–34.0)
MCHC: 33.4 g/dL (ref 30.0–36.0)
MCV: 90.9 fL (ref 80.0–100.0)
Monocytes Absolute: 0.4 K/uL (ref 0.1–1.0)
Monocytes Relative: 6 %
Neutro Abs: 5.1 K/uL (ref 1.7–7.7)
Neutrophils Relative %: 67 %
Platelets: 203 K/uL (ref 150–400)
RBC: 4.94 MIL/uL (ref 4.22–5.81)
RDW: 12.4 % (ref 11.5–15.5)
WBC: 7.6 K/uL (ref 4.0–10.5)
nRBC: 0 % (ref 0.0–0.2)

## 2024-01-20 MED ORDER — SODIUM CHLORIDE 0.9 % IV BOLUS
1000.0000 mL | Freq: Once | INTRAVENOUS | Status: AC
  Administered 2024-01-20: 1000 mL via INTRAVENOUS

## 2024-01-20 NOTE — Discharge Instructions (Addendum)
 We evaluated you for your near fainting episode (near syncope).  Your testing showed mild dehydration.  We suspect that your symptoms were due to heat exposure and dehydration.  Please be sure to drink lots of fluids.  Since you are feeling back to normal, we feel that it is safe to go home.  Please follow-up closely with your primary doctor and try to avoid the heat this weekend.  Please return for any new or worsening symptoms

## 2024-01-20 NOTE — ED Triage Notes (Signed)
 Coming from funeral event, become pale diaphoretic, and weak; no loss of consciousness. EMS called: 500 cc NS bolus BP 121/83; HR 90, 98% RA, cbg 160

## 2024-01-20 NOTE — Progress Notes (Signed)
 Chaplain paged to support family and pt during stressful health event. Chaplain provided reflective listening, a compassionate presence and hospitality.  [address] [place]   01/20/24 1326  Spiritual Encounters  Type of Visit Initial  Care provided to: Patient;Family  Referral source Chaplain team  Reason for visit Routine spiritual support  OnCall Visit Yes  Spiritual Framework  Presenting Themes Impactful experiences and emotions  Interventions  Spiritual Care Interventions Made Established relationship of care and support;Compassionate presence;Reflective listening;Normalization of emotions  Intervention Outcomes  Outcomes Connection to spiritual care;Reduced anxiety  Spiritual Care Plan  Spiritual Care Issues Still Outstanding No further spiritual care needs at this time (see row info)  Advance Directives (For Healthcare)  Does Patient Have a Medical Advance Directive? No  Would patient like information on creating a medical advance directive? No - Patient declined  Mental Health Advance Directives  Does Patient Have a Mental Health Advance Directive? No  Would patient like information on creating a mental health advance directive? No - Patient declined

## 2024-01-20 NOTE — ED Provider Notes (Signed)
 [place] EMERGENCY DEPARTMENT AT [facility] Provider Note  CSN: [id number] Arrival date & time: 01/20/24 1312  Chief Complaint(s) Dizziness  HPI Larry Boyer is a 68 y.o. male history of hypertension, hyperlipidemia, diabetes presenting to the emergency department near syncope.  Patient was at a funeral with family, started to feel hot, lightheaded, got tunnel vision, had to sit down, became very sweaty.  Patient reports he did not lose consciousness.  Family reports that he looked pale.  Paramedics were called, had some low blood pressure, normal blood sugar.  Got some fluids.  Reports en route symptoms of all resolved.  During episode denies any chest pain, abdominal pain, back pain, headaches, nausea, vomiting, leg swelling, leg pain, difficulty breathing, or any other symptoms.  Was feeling normal this morning.  Reports is very hot outside, temperature in the 90s and humid.   Past Medical History History reviewed. No pertinent past medical history. There are no active problems to display for this patient.  Home Medication(s) Prior to Admission medications   Medication Sig Start Date End Date Taking? Authorizing Provider  Semaglutide , 1 MG/DOSE, (OZEMPIC , 1 MG/DOSE,) 4 MG/3ML SOPN Inject 1 mg into the skin once a week. 06/14/23     Semaglutide , 1 MG/DOSE, (OZEMPIC , 1 MG/DOSE,) 4 MG/3ML SOPN Inject 1 mg into the skin once a week. 07/31/23     Semaglutide ,0.25 or 0.5MG /DOS, (OZEMPIC , 0.25 OR 0.5 MG/DOSE,) 2 MG/3ML SOPN Inject 0.5 mg into the skin once a week. 09/25/23                                                                                                                                       Past Surgical History History reviewed. No pertinent surgical history. Family History History reviewed. No pertinent family history.  Social History Social History   Tobacco Use   Smoking status: Never   Smokeless tobacco: Never  Vaping Use   Vaping status: Never Used    Allergies Patient has no known allergies.  Review of Systems Review of Systems  All other systems reviewed and are negative.   Physical Exam Vital Signs  I have reviewed the triage vital signs BP 113/80 (BP Location: Right Arm)   Pulse 90   Temp 97.7 F (36.5 C) (Oral)   Resp 16   Ht 6' 4.8 (1.951 m)   Wt 96.2 kg   SpO2 97%   BMI 25.27 kg/m  Physical Exam Vitals and nursing note reviewed.  Constitutional:      General: He is not in acute distress.    Appearance: Normal appearance.  HENT:     Mouth/Throat:     Mouth: Mucous membranes are moist.  Eyes:     Conjunctiva/sclera: Conjunctivae normal.  Cardiovascular:     Rate and Rhythm: Normal rate and regular rhythm.  Pulmonary:     Effort: Pulmonary effort is normal. No respiratory distress.  Breath sounds: Normal breath sounds.  Abdominal:     General: Abdomen is flat.     Palpations: Abdomen is soft.     Tenderness: There is no abdominal tenderness.  Musculoskeletal:     Right lower leg: No edema.     Left lower leg: No edema.  Skin:    General: Skin is warm and dry.     Capillary Refill: Capillary refill takes less than 2 seconds.  Neurological:     Mental Status: He is alert and oriented to person, place, and time. Mental status is at baseline.  Psychiatric:        Mood and Affect: Mood normal.        Behavior: Behavior normal.     ED Results and Treatments Labs (all labs ordered are listed, but only abnormal results are displayed) Labs Reviewed  BASIC METABOLIC PANEL WITH GFR - Abnormal; Notable for the following components:      Result Value   Glucose, Bld 163 (*)    BUN 24 (*)    Creatinine, Ser 1.25 (*)    All other components within normal limits  CBC WITH DIFFERENTIAL/PLATELET                                                                                                                          Radiology No results found.  Pertinent labs & imaging results that were available during  my care of the patient were reviewed by me and considered in my medical decision making (see MDM for details).  Medications Ordered in ED Medications  sodium chloride  0.9 % bolus 1,000 mL (1,000 mLs Intravenous New Bag/Given 01/20/24 1413)                                                                                                                                     Procedures Procedures  (including critical care time)  Medical Decision Making / ED Course   MDM:  68 year old presenting to the emergency department with near syncopal episode.  Patient overall well-appearing, physical exam nonfocal.  Vital signs reassuring.  Suspect likely near syncope due to heat, possible dehydration.  Patient denies any other current symptoms that would suggest alternative dangerous cause such as ACS, intracranial process, seizure, cardiac near-syncope.  His EKG appears similar to baseline.  His symptoms have currently all resolved.  He is overall very well-appearing.  Will give  some additional fluids, check labs to evaluate for dehydration, anemia or other metabolic process.  If patient continues to feel at his baseline with no symptoms, anticipate likely discharge with outpatient follow-up with his PMD. [facility] syncope rule places patient in low risk group.     Clinical Course as of 01/20/24 1515  Sat Jan 20, 2024  1514 Labs show very mild AKI.  Patient reports he feels better after the fluids.  Will ambulate the patient.  Discussed with him and his family.  Feel that he is safe for discharge at this time, he can follow-up very closely with his primary doctor and return if necessary. Will discharge patient to home. All questions answered. Patient comfortable with plan of discharge. Return precautions discussed with patient and specified on the after visit summary.  [WS]    Clinical Course User Index [WS] Francesca Elsie CROME, MD     Additional history obtained: -Additional history  obtained from family and ems -External records from outside source obtained and reviewed including: Chart review including previous notes, labs, imaging, consultation notes including prior notes    Lab Tests: -I ordered, reviewed, and interpreted labs.   The pertinent results include:   Labs Reviewed  BASIC METABOLIC PANEL WITH GFR - Abnormal; Notable for the following components:      Result Value   Glucose, Bld 163 (*)    BUN 24 (*)    Creatinine, Ser 1.25 (*)    All other components within normal limits  CBC WITH DIFFERENTIAL/PLATELET    Notable for mild AKI   EKG   EKG Interpretation Date/Time:  Saturday January 20 2024 13:23:11 EDT Ventricular Rate:  90 PR Interval:  180 QRS Duration:  108 QT Interval:  351 QTC Calculation: 430 R Axis:   -24  Text Interpretation: Sinus rhythm Borderline left axis deviation Low voltage, precordial leads Anteroseptal infarct, old No significant change since last tracing Confirmed by Francesca Elsie ([id number]) on 01/20/2024 1:25:08 PM          Medicines ordered and prescription drug management: Meds ordered this encounter  Medications   sodium chloride  0.9 % bolus 1,000 mL    -I have reviewed the patients home medicines and have made adjustments as needed    Reevaluation: After the interventions noted above, I reevaluated the patient and found that their symptoms have resolved  Co morbidities that complicate the patient evaluation History reviewed. No pertinent past medical history.    Dispostion: Disposition decision including need for hospitalization was considered, and patient discharged from emergency department.    Final Clinical Impression(s) / ED Diagnoses Final diagnoses:  Near syncope     This chart was dictated using voice recognition software.  Despite best efforts to proofread,  errors can occur which can change the documentation meaning.    Francesca Elsie CROME, MD 01/20/24 1515

## 2024-02-22 ENCOUNTER — Other Ambulatory Visit (HOSPITAL_BASED_OUTPATIENT_CLINIC_OR_DEPARTMENT_OTHER): Payer: Self-pay

## 2024-02-22 MED ORDER — OZEMPIC (0.25 OR 0.5 MG/DOSE) 2 MG/3ML ~~LOC~~ SOPN
0.5000 mg | PEN_INJECTOR | SUBCUTANEOUS | 5 refills | Status: AC
  Filled 2024-02-22: qty 3, 28d supply, fill #0
  Filled 2024-03-16: qty 3, 28d supply, fill #1
  Filled 2024-04-16: qty 3, 28d supply, fill #2
  Filled 2024-05-13: qty 3, 28d supply, fill #3
  Filled 2024-06-11: qty 3, 28d supply, fill #4
  Filled 2024-07-09: qty 3, 28d supply, fill #5

## 2024-03-18 ENCOUNTER — Other Ambulatory Visit: Payer: Self-pay

## 2024-03-18 ENCOUNTER — Other Ambulatory Visit (HOSPITAL_BASED_OUTPATIENT_CLINIC_OR_DEPARTMENT_OTHER): Payer: Self-pay

## 2024-06-12 ENCOUNTER — Other Ambulatory Visit (HOSPITAL_BASED_OUTPATIENT_CLINIC_OR_DEPARTMENT_OTHER): Payer: Self-pay

## 2024-07-09 ENCOUNTER — Other Ambulatory Visit (HOSPITAL_BASED_OUTPATIENT_CLINIC_OR_DEPARTMENT_OTHER): Payer: Self-pay

## 2024-07-10 ENCOUNTER — Other Ambulatory Visit (HOSPITAL_BASED_OUTPATIENT_CLINIC_OR_DEPARTMENT_OTHER): Payer: Self-pay

## 2024-07-12 ENCOUNTER — Other Ambulatory Visit (HOSPITAL_BASED_OUTPATIENT_CLINIC_OR_DEPARTMENT_OTHER): Payer: Self-pay

## 2024-07-17 ENCOUNTER — Other Ambulatory Visit (HOSPITAL_BASED_OUTPATIENT_CLINIC_OR_DEPARTMENT_OTHER): Payer: Self-pay

## 2024-07-18 ENCOUNTER — Other Ambulatory Visit (HOSPITAL_BASED_OUTPATIENT_CLINIC_OR_DEPARTMENT_OTHER): Payer: Self-pay
